# Patient Record
Sex: Male | Born: 1985 | Race: White | Hispanic: No | Marital: Single | Smoking: Never smoker
Health system: Southern US, Community
[De-identification: ages and names within clinical notes are randomized; demographics above are authoritative.]

---

## 2012-11-23 ENCOUNTER — Ambulatory Visit: Payer: Self-pay | Admitting: Family Medicine

## 2012-11-23 VITALS — BP 110/70 | HR 97 | Temp 98.5°F | Resp 18 | Ht 69.5 in | Wt 181.0 lb

## 2012-11-23 DIAGNOSIS — R220 Localized swelling, mass and lump, head: Secondary | ICD-10-CM

## 2012-11-23 DIAGNOSIS — T7840XA Allergy, unspecified, initial encounter: Secondary | ICD-10-CM

## 2012-11-23 MED ORDER — PREDNISONE 20 MG PO TABS: ORAL_TABLET | ORAL | Status: AC

## 2012-11-23 NOTE — Progress Notes (Signed)
[address]   [place], [place]  [address]   [phone]  Subjective:    Patient ID: Dennis Rivera, male    DOB: [birth year], 27 y.o.   MRN: [id number]  HPI This 27 y.o. male presents for evaluation of lip swelling. Onset last night.  Ate vanilla wafers, milk.  Also drank Lychee soda which is a fruit; can find in [place].  No tongue swelling; no throat swelling; no wheezing; no SOB.  Took antihistamine 24 hour duration last night and this morning.  Had lip swelling one month ago milder; this is fourth episode in one month.   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  No new foods; no new medications; no new pets; no new soaps; +new shampoo.  No unusual foods.  Grew up in [place] and has eaten seafood long term.  Rare nut use. No history of asthma, eczema, sensitive skin.  No family history of allergies.  No mucosal sores; no sore throat.  Upper and lower lip swollen last night; now only upper lip swollen.   Review of Systems  Constitutional: Negative for fever, chills, diaphoresis and fatigue.  HENT: Negative for sore throat, trouble swallowing and voice change.   Eyes: Negative for redness.  Respiratory: Negative for cough, shortness of breath, wheezing and stridor.   Skin: Negative for rash.   History reviewed. No pertinent past medical history. History reviewed. No pertinent past surgical history. No Known Allergies No current outpatient prescriptions on file prior to visit.   No current facility-administered medications on file prior to visit.   History   Social History  . Marital Status: Single    Spouse Name: N/A    Number of Children: N/A  . Years of Education: N/A   Occupational History  . Not on file.   Social History Main Topics  . Smoking status: Never Smoker   . Smokeless tobacco: Not on file  . Alcohol Use: Not on file  . Drug Use: Not on file  . Sexual Activity: Not on file   Other Topics Concern  . Not on file   Social History Narrative   . No narrative on file       Objective:   Physical Exam  Nursing note and vitals reviewed. Constitutional: He is oriented to person, place, and time. He appears well-developed and well-nourished. No distress.  HENT:  Head: Normocephalic and atraumatic.  Right Ear: External ear normal.  Left Ear: External ear normal.  Nose: Nose normal.  Mouth/Throat: Oropharynx is clear and moist and mucous membranes are normal. No oropharyngeal exudate, posterior oropharyngeal edema, posterior oropharyngeal erythema or tonsillar abscesses.  Diffuse upper lip swelling without mucosal lesions.  No lower lip swelling.  +Dental caries and poor dentition present.    Neck: Normal range of motion. Neck supple. No tracheal deviation present.  Cardiovascular: Normal rate and regular rhythm.   No murmur heard. Pulmonary/Chest: Effort normal and breath sounds normal. No stridor. No respiratory distress. He has no wheezes. He has no rales.  Lymphadenopathy:    He has no cervical adenopathy.  Neurological: He is alert and oriented to person, place, and time. No cranial nerve deficit. He exhibits normal muscle tone. Coordination normal.  Skin: Skin is warm and dry. No rash noted. He is not diaphoretic.  Psychiatric: He has a normal mood and affect. His behavior is normal.   RANITIDINE 150MG  PO ADMINISTERED IN OFFICE BY ANGIE.    Assessment & Plan:  Allergic reaction,  initial encounter - Plan: Ambulatory referral to Allergy  Swollen upper lip - Plan: Ambulatory referral to Allergy   1. Allergic Reaction with lip swelling upper:  New.  Etiology unclear.  S/p 24 hour antihistamine in past twelve hours; fourth episode in past month; recommend 24 hour antihistamine; recommend Benadryl 25mg  qhs; recommend Zantac 150mg  bid; rx for Prednisone provided.  Refer to allergist due to recurrent nature and unknown cause.  No labs obtained due to self pay status; will defer to allergist to determine if labs warranted.  Advised  if throat or tongue swelling occurs, take 2 Benadryl and call 911 and/or present to ED. Advised to stop all medication one week before allergy appointment. 2. Upper lip swelling:  New. As above.  Meds ordered this encounter  Medications  . diphenhydrAMINE (BENADRYL) 25 MG tablet    Sig: Take 25 mg by mouth every 6 (six) hours as needed for itching.  . predniSONE (DELTASONE) 20 MG tablet    Sig: Three tablets daily x 1 day then two tablets daily x 5 days then one tablet daily x 5 days    Dispense:  18 tablet    Refill:  0

## 2012-11-23 NOTE — Patient Instructions (Signed)
1.  24 hour anithistamine once daily. 2.  Take one Benadryl 25mg  at bedtime for the next week. 3.  Take Zantac/Ranitidine 150mg  one tablet twice daily for one week. 4.  Take Prednisone as prescribed. 5.  If you develop throat swelling or tongue swelling, take 2 Benadryl and call 911 or present to emergency department.

## 2014-08-13 ENCOUNTER — Emergency Department: Payer: Worker's Compensation

## 2014-08-13 ENCOUNTER — Encounter: Payer: Self-pay | Admitting: Emergency Medicine

## 2014-08-13 ENCOUNTER — Emergency Department
Admission: EM | Admit: 2014-08-13 | Discharge: 2014-08-13 | Disposition: A | Discharge status: Home / Self Care | Payer: Worker's Compensation | Attending: Emergency Medicine | Admitting: Emergency Medicine

## 2014-08-13 DIAGNOSIS — Y998 Other external cause status: Secondary | ICD-10-CM | POA: Insufficient documentation

## 2014-08-13 DIAGNOSIS — W208XXA Other cause of strike by thrown, projected or falling object, initial encounter: Secondary | ICD-10-CM | POA: Insufficient documentation

## 2014-08-13 DIAGNOSIS — Y9389 Activity, other specified: Secondary | ICD-10-CM | POA: Insufficient documentation

## 2014-08-13 DIAGNOSIS — Z23 Encounter for immunization: Secondary | ICD-10-CM | POA: Insufficient documentation

## 2014-08-13 DIAGNOSIS — S61512A Laceration without foreign body of left wrist, initial encounter: Secondary | ICD-10-CM | POA: Insufficient documentation

## 2014-08-13 DIAGNOSIS — Y9289 Other specified places as the place of occurrence of the external cause: Secondary | ICD-10-CM | POA: Insufficient documentation

## 2014-08-13 MED ORDER — LIDOCAINE HCL (PF) 1 % IJ SOLN
10.0000 mL | Freq: Once | INTRAMUSCULAR | Status: DC
  Filled 2014-08-13: qty 10

## 2014-08-13 MED ORDER — TETANUS-DIPHTH-ACELL PERTUSSIS 5-2.5-18.5 LF-MCG/0.5 IM SUSP
0.5000 mL | Freq: Once | INTRAMUSCULAR | Status: AC
Start: 2014-08-13 — End: 2014-08-13
  Administered 2014-08-13: 0.5 mL via INTRAMUSCULAR
  Filled 2014-08-13: qty 0.5

## 2014-08-13 MED ORDER — LIDOCAINE HCL (PF) 1 % IJ SOLN
5.0000 mL | Freq: Once | INTRAMUSCULAR | Status: DC
  Filled 2014-08-13: qty 5

## 2014-08-13 NOTE — ED Notes (Signed)
Lac to left wrist from hitting it on sheet metal at work, bleeding controlled, no deficits noted

## 2014-08-13 NOTE — ED Provider Notes (Signed)
[facility] Emergency Department Provider Note  ____________________________________________  Time seen: 11:20  I have reviewed the triage vital signs and the nursing notes.   HISTORY  Chief Complaint Laceration    HPI Dennis Rivera is a 29 y.o. male is here with laceration to his left wrist. 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 He was unable to control the bleeding and is bled for 45 minutes according to the patient. He is not up-to-date on his immunizations on his tetanus shot. Pain at this time is 5 out of 10.   History reviewed. No pertinent past medical history.  There are no active problems to display for this patient.   History reviewed. No pertinent past surgical history.  Current Outpatient Rx  Name  Route  Sig  Dispense  Refill  . diphenhydrAMINE (BENADRYL) 25 MG tablet   Oral   Take 25 mg by mouth every 6 (six) hours as needed for itching.         . predniSONE (DELTASONE) 20 MG tablet      Three tablets daily x 1 day then two tablets daily x 5 days then one tablet daily x 5 days   18 tablet   0     Allergies Review of patient's allergies indicates no known allergies.  No family history on file.  Social History History  Substance Use Topics  . Smoking status: Never Smoker   . Smokeless tobacco: Not on file  . Alcohol Use: No    Review of Systems Constitutional: No fever/chills Eyes: No visual changes. ENT: No sore throat. Cardiovascular: Denies chest pain. Respiratory: Denies shortness of breath. Gastrointestinal: No abdominal pain.  No nausea, no vomiting. Musculoskeletal: Negative for back pain. Skin: Negative for rash. Neurological: Negative for headaches, focal weakness or numbness.  10-point ROS otherwise negative.  ____________________________________________   PHYSICAL EXAM:  VITAL SIGNS: ED Triage  Vitals  Enc Vitals Group     BP 08/13/14 1051 128/73 mmHg     Pulse Rate 08/13/14 1051 118     Resp 08/13/14 1051 18     Temp 08/13/14 1051 98.3 F (36.8 C)     Temp Source 08/13/14 1051 Oral     SpO2 08/13/14 1051 94 %     Weight 08/13/14 1051 180 lb (81.647 kg)     Height 08/13/14 1051  (1.778 m)     Head Cir --      Peak Flow --      Pain Score 08/13/14 1104 5     Pain Loc --      Pain Edu? --      Excl. in GC? --     Constitutional: Alert and oriented. Well appearing and in no acute distress. Eyes: Conjunctivae are normal. PERRL. EOMI. Head: Atraumatic. Nose: No congestion/rhinnorhea. Neck: No stridor.   Cardiovascular: Normal rate, regular rhythm. Grossly normal heart sounds.  Good peripheral circulation. Respiratory: Normal respiratory effort.  No retractions. Lungs CTAB. Gastrointestinal: Soft and nontender. No distention.  Musculoskeletal: No lower extremity tenderness nor edema.  No joint effusions. Left wrist no restriction in range of motion. Motor sensory function intact. Neurologic:  Normal speech and language. No gross focal neurologic deficits are appreciated. Speech is normal. No gait instability. Skin:  Skin is warm, dry and intact. No rash noted. 4.0 centimeter laceration noted to left wrist. Bleeding is under control at this time. Laceration  is superficial Psychiatric: Mood and affect are normal. Speech and behavior are normal.  ____________________________________________   LABS (all labs ordered are listed, but only abnormal results are displayed)  Labs Reviewed - No data to display   RADIOLOGY  X-ray of left wrist showed no bony or abnormality no foreign bodies were noted. I, Tommi Rumpshonda L Summers, personally viewed and evaluated these images as part of my medical decision making.  ____________________________________________   PROCEDURES  Procedure(s) performed: LACERATION REPAIR Performed by: Tommi Rumpshonda L Summers Authorized by: Tommi Rumpshonda L  Summers Consent: Verbal consent obtained. Risks and benefits: risks, benefits and alternatives were discussed Consent given by: patient Patient identity confirmed: provided demographic data Prepped and Draped in normal sterile fashion Wound explored  Laceration Location: Left wrist  Laceration Length: 4.0 cm  No Foreign Bodies seen or palpated  Anesthesia: local infiltration  Local anesthetic: lidocaine 1 % without epinephrine  Anesthetic total: 3 rrigation method: syringe Amount of cleaning: standard  Skin closure:  4-0 Ethilon r of sutures:  8  Techniqu   simple interrupted 6, mattress sutures 2  Patient tolerated the procedure well with no immediate complications.   Critical Care performed: No  ____________________________________________   INITIAL IMPRESSION / ASSESSMENT AND PLAN / ED COURSE  Pertinent labs & imaging results that were available during my care of the patient were reviewed by me and considered in my medical decision making (see chart for details  patient tolerated the procedure extremely well.   FINAL CLINICAL IMPRESSION(S) / ED DIAGNOSES  Final diagnoses:  Laceration of left wrist, initial encounter      Tommi RumpsRhonda L Summers, PA-C 08/13/14 1604  Phineas SemenGraydon Goodman, MD 08/14/14 1021

## 2014-08-13 NOTE — ED Notes (Signed)
Patient to ER for laceration to left wrist. States piece of sheet metal fell and sliced wrist while at work.

## 2014-08-13 NOTE — Discharge Instructions (Signed)
WOUND CARE Please return in 12 days to have your stitches/staples removed or sooner if you have concerns.  Keep area clean and dry for 24 hours. Do not remove bandage, if applied.  After 24 hours, remove bandage and wash wound gently with mild soap and warm water. Reapply a new bandage after cleaning wound, if directed.  Continue daily cleansing with soap and water until stitches/staples are removed.  Do not apply any ointments or creams to the wound while stitches/staples are in place, as this may cause delayed healing.  Notify the office if you experience any of the following signs of infection: Swelling, redness, pus drainage, streaking, fever >101.0 F  Notify the office if you experience excessive bleeding that does not stop after 15-20 minutes of constant, firm pressure.

## 2014-08-24 ENCOUNTER — Encounter: Payer: Self-pay | Admitting: Medical Oncology

## 2014-08-24 ENCOUNTER — Emergency Department
Admission: EM | Admit: 2014-08-24 | Discharge: 2014-08-24 | Disposition: A | Discharge status: Home / Self Care | Payer: Worker's Compensation | Attending: Emergency Medicine | Admitting: Emergency Medicine

## 2014-08-24 DIAGNOSIS — Z4802 Encounter for removal of sutures: Secondary | ICD-10-CM

## 2014-08-24 NOTE — ED Provider Notes (Addendum)
[facility] Emergency Department Provider Note ____________________________________________  Time seen: 1415  I have reviewed the triage vital signs and the nursing notes.  HISTORY  Chief Complaint  Suture / Staple Removal  HPI  Dennis Rivera is a 29 y.o. male presents for suture removal. The wound is well healed without signs of infection.  The sutures are removed. Wound care and activity instructions given. Return prn.  Review of Systems Constitutional: No fever/chills Eyes: No visual changes. ENT: No sore throat. Cardiovascular: Denies chest pain. Respiratory: Denies shortness of breath. Gastrointestinal: No abdominal pain. No nausea, no vomiting. Musculoskeletal: Negative for back pain. Skin: Negative for rash. Neurological: Negative for headaches, focal weakness or numbness.  10-point ROS otherwise negative.  SUTURE REMOVAL Performed by: Lissa Hoard  Consent: Verbal consent obtained. Patient identity confirmed: provided demographic data  Location details: volar left wrist  Wound Appearance: clean  Sutures/Staples Removed: 9 sutures  Facility: sutures placed in this facility   Steri-strips applied to wound to reduce dehiscence.   Patient tolerance: Patient tolerated the procedure well with no immediate complications.  INITIAL IMPRESSION / ASSESSMENT AND PLAN / ED COURSE  Visit for suture removal. No interim complications. Wound care instructions provided.   FINAL CLINICAL IMPRESSION(S) / ED DIAGNOSES  Final diagnoses:  Visit for suture removal    Lissa Hoard, PA-C 08/24/14 1423  Jene Every, MD 08/24/14 [address] [place], PA-C 09/14/14 0008  Jene Every, MD 09/14/14 1309

## 2014-08-24 NOTE — Discharge Instructions (Signed)
Suture Removal, Care After Refer to this sheet in the next few weeks. These instructions provide you with information on caring for yourself after your procedure. Your health care provider may also give you more specific instructions. Your treatment has been planned according to current medical practices, but problems sometimes occur. Call your health care provider if you have any problems or questions after your procedure. WHAT TO EXPECT AFTER THE PROCEDURE After your stitches (sutures) are removed, it is typical to have the following:  Some discomfort and swelling in the wound area.  Slight redness in the area. HOME CARE INSTRUCTIONS   If you have skin adhesive strips over the wound area, do not take the strips off. They will fall off on their own in a few days. If the strips remain in place after 14 days, you may remove them.  Change any bandages (dressings) at least once a day or as directed by your health care provider. If the bandage sticks, soak it off with warm, soapy water.  Apply cream or ointment only as directed by your health care provider. If using cream or ointment, wash the area with soap and water 2 times a day to remove all the cream or ointment. Rinse off the soap and pat the area dry with a clean towel.  Keep the wound area dry and clean. If the bandage becomes wet or dirty, or if it develops a bad smell, change it as soon as possible.  Continue to protect the wound from injury.  Use sunscreen when out in the sun. New scars become sunburned easily. SEEK MEDICAL CARE IF:  You have increasing redness, swelling, or pain in the wound.  You see pus coming from the wound.  You have a fever.  You notice a bad smell coming from the wound or dressing.  Your wound breaks open (edges not staying together). Document Released: 10/14/2000 Document Revised: 11/09/2012 Document Reviewed: 08/31/2012 [facility] [place] Patient Information 2015 [place], [place]. This information is not  intended to replace advice given to you by your health care provider. Make sure you discuss any questions you have with your health care provider.  Keep the wound clean, dry, and covered as needed.

## 2014-08-24 NOTE — ED Notes (Signed)
Here for stitches to be removed from left wrist.

## 2017-05-11 IMAGING — CR DG WRIST COMPLETE 3+V*L*
1 series · 4 of 4 positions shown · non-contrast
Comparison: None.

CLINICAL DATA: Laceration to the left wrist today.

EXAM:
LEFT WRIST - COMPLETE 3+ VIEW

[Series 1: pa · 0.17mm/px · 4 of 4 slices shown]
[im 1/4]
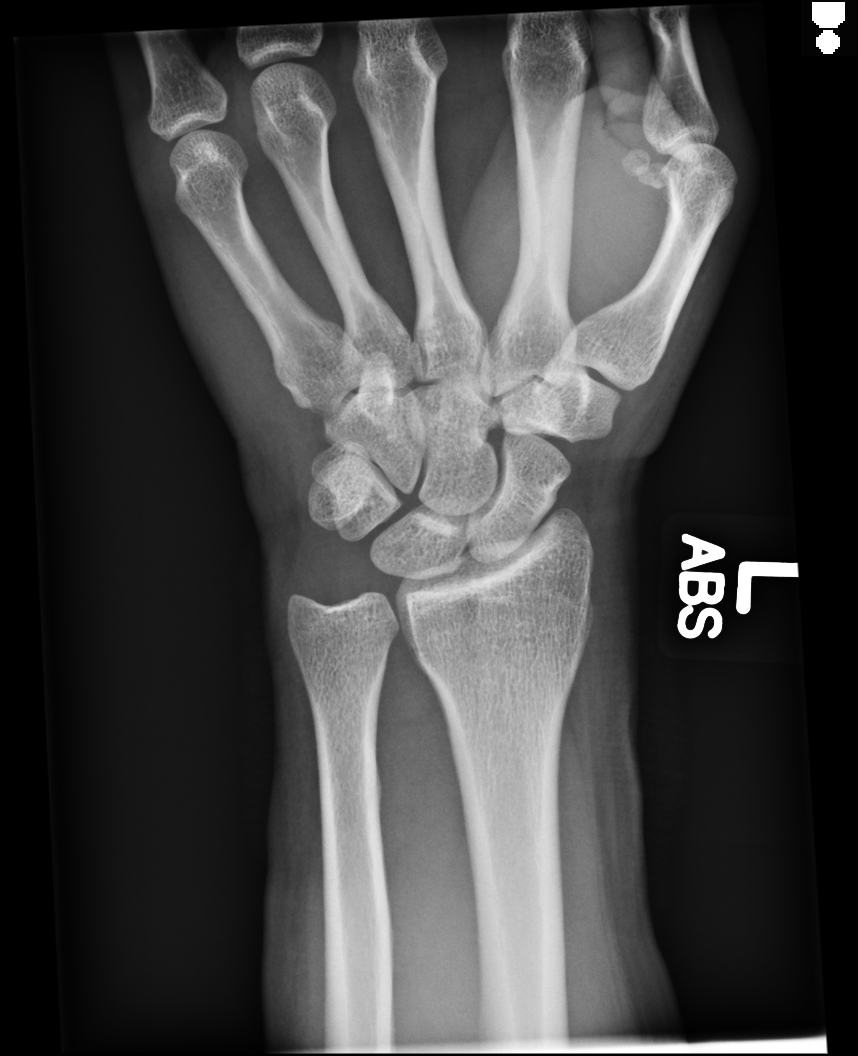
[im 2/4]
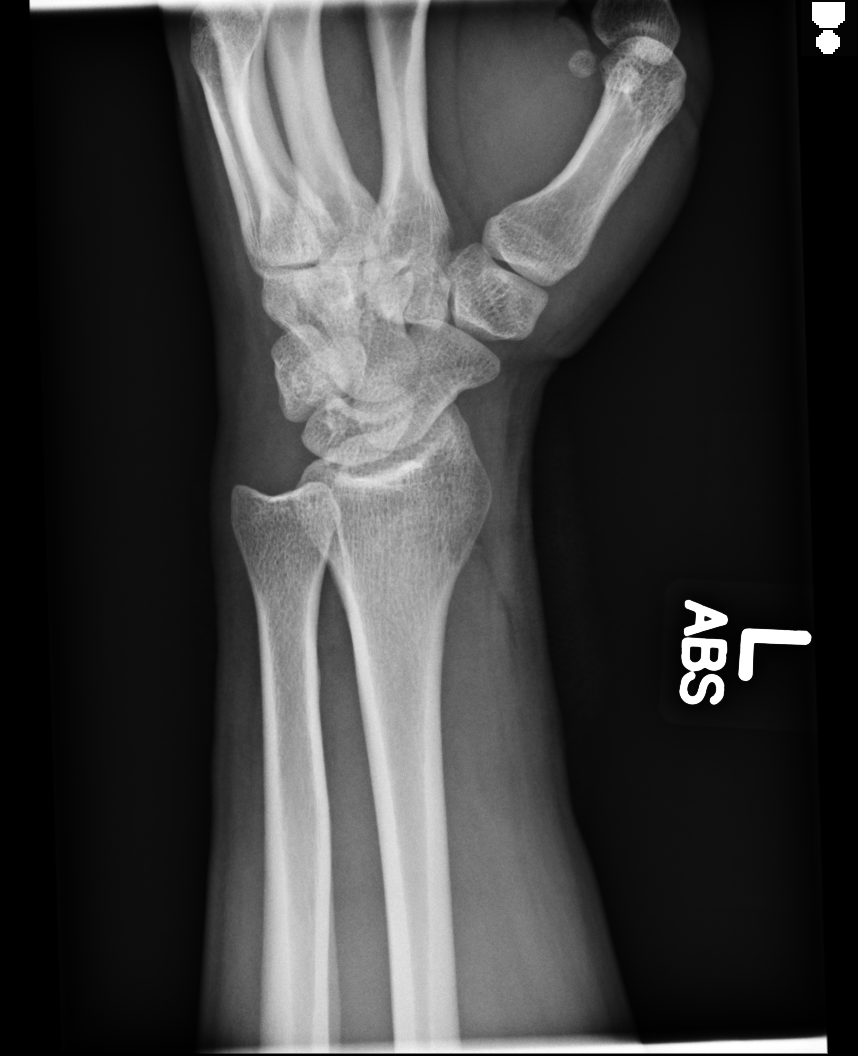
[im 3/4]
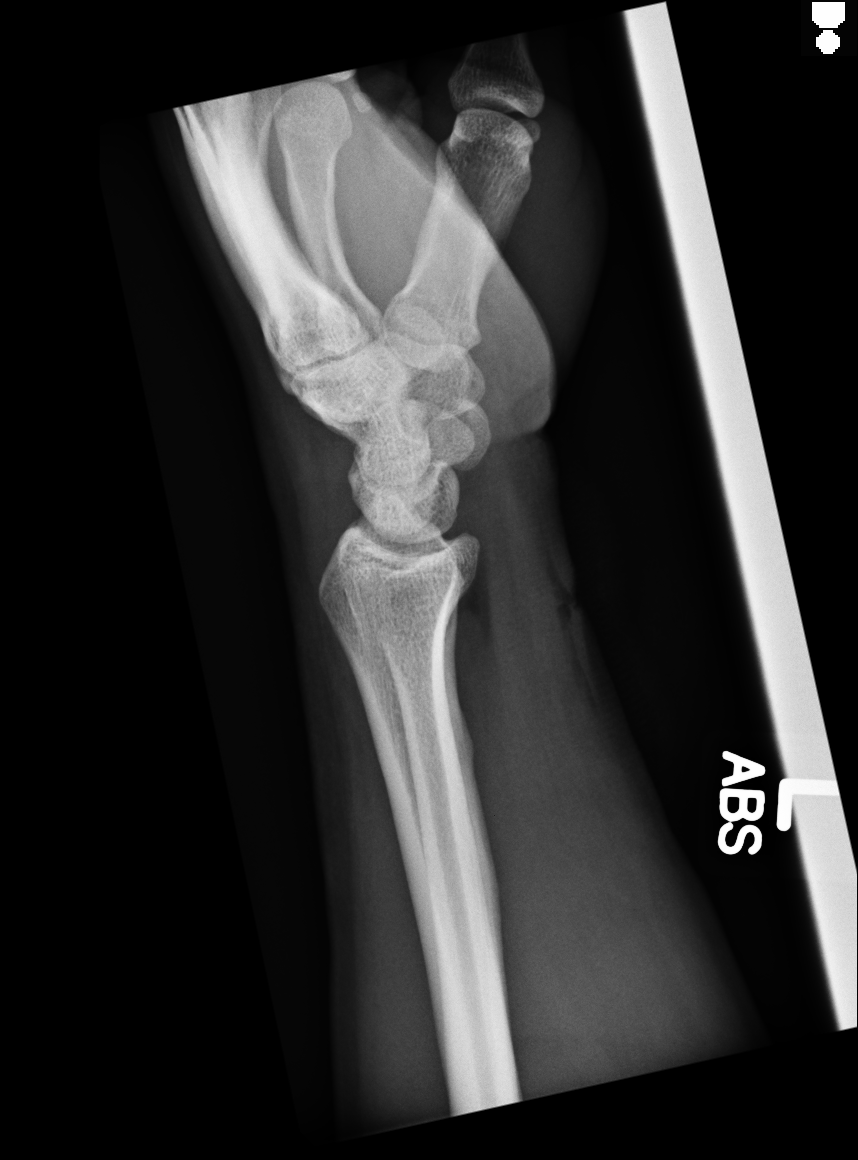
[im 4/4]
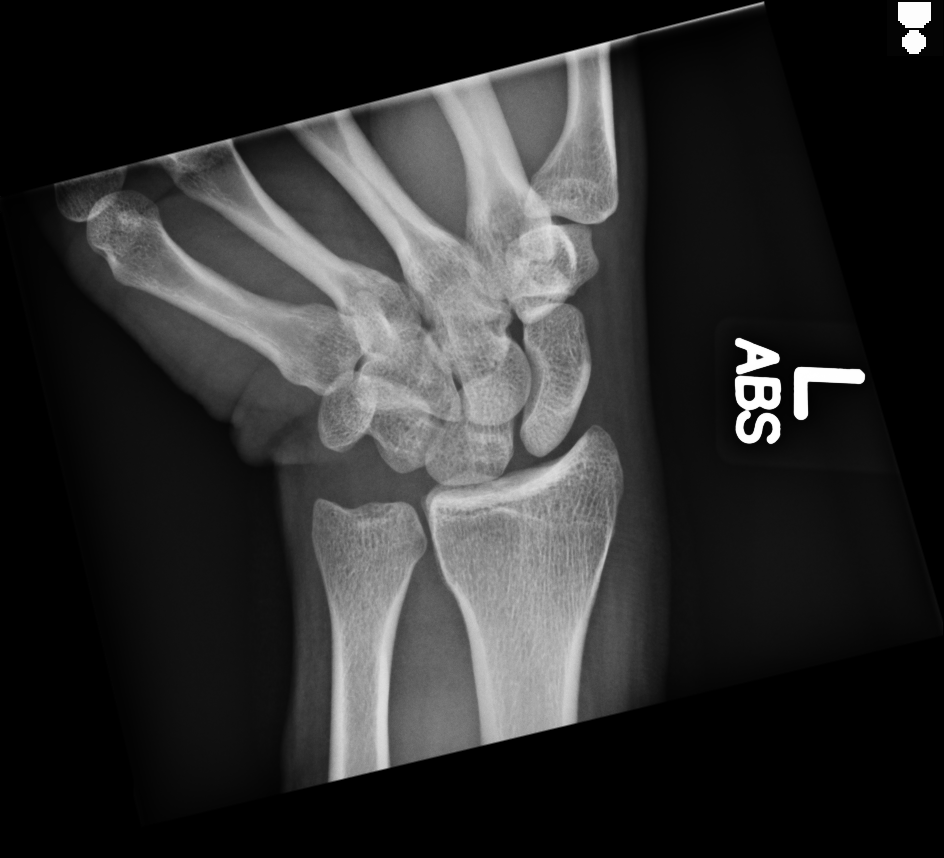

[4 of 4 positions shown; findings below may reference images not displayed]

FINDINGS: Osseous structures are normal. No radiodense foreign bodies.
Laceration to the volar aspect of the distal forearm.
IMPRESSION: Soft tissue laceration.  Otherwise, normal exam.
